# Patient Record
Sex: Female | Born: 2000 | Race: Black or African American | Hispanic: No | Marital: Single | Smoking: Never smoker
Health system: Southern US, Community
[De-identification: ages and names within clinical notes are randomized; demographics above are authoritative.]

## PROBLEM LIST (undated history)

## (undated) DIAGNOSIS — L309 Dermatitis, unspecified: Secondary | ICD-10-CM

## (undated) DIAGNOSIS — M419 Scoliosis, unspecified: Secondary | ICD-10-CM

## (undated) HISTORY — DX: Scoliosis, unspecified: M41.9

---
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
## 2020-07-09 ENCOUNTER — Emergency Department (HOSPITAL_BASED_OUTPATIENT_CLINIC_OR_DEPARTMENT_OTHER)
Admission: EM | Admit: 2020-07-09 | Discharge: 2020-07-09 | Disposition: A | Discharge status: Home / Self Care | Payer: Medicaid Other | Attending: Emergency Medicine | Admitting: Emergency Medicine

## 2020-07-09 ENCOUNTER — Other Ambulatory Visit: Payer: Self-pay

## 2020-07-09 ENCOUNTER — Encounter (HOSPITAL_BASED_OUTPATIENT_CLINIC_OR_DEPARTMENT_OTHER): Payer: Self-pay

## 2020-07-09 DIAGNOSIS — Z711 Person with feared health complaint in whom no diagnosis is made: Secondary | ICD-10-CM | POA: Insufficient documentation

## 2020-07-09 DIAGNOSIS — T7621XA Adult sexual abuse, suspected, initial encounter: Secondary | ICD-10-CM | POA: Diagnosis present

## 2020-07-09 LAB — URINALYSIS, ROUTINE W REFLEX MICROSCOPIC
Bilirubin Urine: NEGATIVE
Glucose, UA: NEGATIVE mg/dL
Ketones, ur: >80 mg/dL — AB
Leukocytes,Ua: NEGATIVE
Nitrite: NEGATIVE
Protein, ur: NEGATIVE mg/dL
Specific Gravity, Urine: 1.02 (ref 1.005–1.030)
pH: 6 (ref 5.0–8.0)

## 2020-07-09 LAB — URINALYSIS, MICROSCOPIC (REFLEX): RBC / HPF: >50 RBC/hpf (ref 0–5)

## 2020-07-09 LAB — PREGNANCY, URINE: Preg Test, Ur: NEGATIVE

## 2020-07-09 LAB — RAPID URINE DRUG SCREEN, HOSP PERFORMED
Amphetamines: NOT DETECTED
Barbiturates: NOT DETECTED
Benzodiazepines: NOT DETECTED
Cocaine: NOT DETECTED
Opiates: NOT DETECTED
Tetrahydrocannabinol: NOT DETECTED

## 2020-07-09 NOTE — ED Notes (Signed)
Patient sitting in room at this time. Pt in no noted distress. MD to the bedside
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
## 2020-07-09 NOTE — Discharge Instructions (Signed)
I do not see any signs of injury right now.  You did not have any obvious toxins in your urine.  Stay hydrated.  Return to ER if you have pelvic pain, vaginal discharge, vomiting, fever.

## 2020-07-09 NOTE — ED Notes (Signed)
SANE nurse contacted
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
## 2020-07-09 NOTE — ED Notes (Signed)
Pt speaking with Amy SANE RN, per Silverio Lay MD urine sample.
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
## 2020-07-10 NOTE — Consult Note (Signed)
Notified of forensic nurse consult requested by Dr. Silverio Lay
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
## 2020-07-10 NOTE — Consult Note (Signed)
The SANE/FNE (Forensic Nurse Examiner) consult has been completed. The primary RN and provider have been notified. Please contact the SANE/FNE nurse on call (listed in Amion) with any further concerns.  

## 2023-06-15 ENCOUNTER — Ambulatory Visit
Admission: EM | Admit: 2023-06-15 | Discharge: 2023-06-15 | Discharge status: Against Medical Advice | Payer: Medicaid Other
